# Patient Record
Sex: Female | Born: 1945 | Race: White | Hispanic: No | Smoking: Never smoker
Health system: Southern US, Community
[De-identification: ages and names within clinical notes are randomized; demographics above are authoritative.]

## PROBLEM LIST (undated history)

## (undated) DIAGNOSIS — R569 Unspecified convulsions: Secondary | ICD-10-CM

## (undated) DIAGNOSIS — I639 Cerebral infarction, unspecified: Secondary | ICD-10-CM

## (undated) DIAGNOSIS — F0280 Dementia in other diseases classified elsewhere without behavioral disturbance: Secondary | ICD-10-CM

## (undated) DIAGNOSIS — C801 Malignant (primary) neoplasm, unspecified: Secondary | ICD-10-CM

---

## 2004-02-17 HISTORY — PX: BRAIN SURGERY: SHX531

## 2020-07-26 ENCOUNTER — Other Ambulatory Visit: Payer: Self-pay

## 2020-07-26 ENCOUNTER — Emergency Department (HOSPITAL_COMMUNITY): Payer: Medicare PPO

## 2020-07-26 ENCOUNTER — Emergency Department (HOSPITAL_COMMUNITY)
Admission: EM | Admit: 2020-07-26 | Discharge: 2020-07-26 | Disposition: A | Discharge status: Home / Self Care | Payer: Medicare PPO | Attending: Emergency Medicine | Admitting: Emergency Medicine

## 2020-07-26 ENCOUNTER — Emergency Department (HOSPITAL_BASED_OUTPATIENT_CLINIC_OR_DEPARTMENT_OTHER)
Admission: EM | Admit: 2020-07-26 | Discharge: 2020-07-26 | Disposition: A | Discharge status: Home / Self Care | Payer: Medicare PPO | Source: Home / Self Care | Attending: Emergency Medicine | Admitting: Emergency Medicine

## 2020-07-26 ENCOUNTER — Encounter (HOSPITAL_BASED_OUTPATIENT_CLINIC_OR_DEPARTMENT_OTHER): Payer: Self-pay | Admitting: Emergency Medicine

## 2020-07-26 ENCOUNTER — Encounter (HOSPITAL_COMMUNITY): Payer: Self-pay | Admitting: Emergency Medicine

## 2020-07-26 DIAGNOSIS — Z859 Personal history of malignant neoplasm, unspecified: Secondary | ICD-10-CM | POA: Insufficient documentation

## 2020-07-26 DIAGNOSIS — R079 Chest pain, unspecified: Secondary | ICD-10-CM | POA: Insufficient documentation

## 2020-07-26 HISTORY — DX: Malignant (primary) neoplasm, unspecified: C80.1

## 2020-07-26 LAB — BASIC METABOLIC PANEL
Anion gap: 7 (ref 5–15)
BUN: 10 mg/dL (ref 8–23)
CO2: 28 mmol/L (ref 22–32)
Calcium: 8.8 mg/dL — ABNORMAL LOW (ref 8.9–10.3)
Chloride: 103 mmol/L (ref 98–111)
Creatinine, Ser: 0.69 mg/dL (ref 0.44–1.00)
GFR, Estimated: >60 mL/min (ref >60)
Glucose, Bld: 86 mg/dL (ref 70–99)
Potassium: 3.9 mmol/L (ref 3.5–5.1)
Sodium: 138 mmol/L (ref 135–145)

## 2020-07-26 LAB — CBC WITH DIFFERENTIAL/PLATELET
Abs Immature Granulocytes: 0.02 10*3/uL (ref 0.00–0.07)
Basophils Absolute: 0 10*3/uL (ref 0.0–0.1)
Basophils Relative: 1 %
Eosinophils Absolute: 0.4 10*3/uL (ref 0.0–0.5)
Eosinophils Relative: 5 %
HCT: 42 % (ref 36.0–46.0)
Hemoglobin: 13.6 g/dL (ref 12.0–15.0)
Immature Granulocytes: 0 %
Lymphocytes Relative: 32 %
Lymphs Abs: 2.2 10*3/uL (ref 0.7–4.0)
MCH: 31.6 pg (ref 26.0–34.0)
MCHC: 32.4 g/dL (ref 30.0–36.0)
MCV: 97.4 fL (ref 80.0–100.0)
Monocytes Absolute: 0.5 10*3/uL (ref 0.1–1.0)
Monocytes Relative: 7 %
Neutro Abs: 3.8 10*3/uL (ref 1.7–7.7)
Neutrophils Relative %: 55 %
Platelets: 218 10*3/uL (ref 150–400)
RBC: 4.31 MIL/uL (ref 3.87–5.11)
RDW: 13.6 % (ref 11.5–15.5)
WBC: 6.9 10*3/uL (ref 4.0–10.5)
nRBC: 0 % (ref 0.0–0.2)

## 2020-07-26 LAB — BRAIN NATRIURETIC PEPTIDE: B Natriuretic Peptide: 25.9 pg/mL (ref 0.0–100.0)

## 2020-07-26 LAB — TROPONIN I (HIGH SENSITIVITY)
Troponin I (High Sensitivity): 2 ng/L (ref <18)
Troponin I (High Sensitivity): 3 ng/L (ref <18)

## 2020-07-26 NOTE — ED Provider Notes (Addendum)
Emergency Medicine Provider Triage Evaluation Note  Regina Woods , a 75 y.o. female  was evaluated in triage.  Pt complains of intermittent chest pain x2 weeks. She has had some intermittent shortness off breath as well.  Review of Systems  Positive: Chest pain, sob Negative: Fever  Physical Exam  There were no vitals taken for this visit. Gen:   Awake, no distress   Resp:  Normal effort  MSK:   Moves extremities without difficulty  Other:  Lungs ctab, heart with rrr  Medical Decision Making  Medically screening exam initiated at 3:55 PM.  Appropriate orders placed.  Ellis Koffler was informed that the remainder of the evaluation will be completed by another provider, this initial triage assessment does not replace that evaluation, and the importance of remaining in the ED until their evaluation is complete.     Rodney Booze, PA-C 07/26/20 1556    Breck Hollinger S, PA-C 07/26/20 1557    Noemi Chapel, MD 07/29/20 1459

## 2020-07-26 NOTE — ED Provider Notes (Signed)
[facility] Emergency Department Provider Note MRN:  [id number]  Arrival date & time: 07/26/20     Chief Complaint   Chest Pain   History of Present Illness   Regina Woods is a 75 y.o. year-old female with a history of multiple myeloma presenting to the ED with chief complaint of chest pain.  Intermittent chest pain for the past 2 weeks.  Decided to take the time to pay attention to it today.  Describes it as an arrow type pain, sharp, beneath the left breast.  Cannot think of any exacerbating or alleviating factors.  Denies any dizziness or diaphoresis, no nausea or vomiting, no shortness of breath.  No recent leg pain or swelling.  No cough, no fever.  No abdominal pain, no other complaints.  Symptoms are not currently present.  Left [facility] emergency department to seek care here due to the extended wait times.  Review of Systems  A complete 10 system review of systems was obtained and all systems are negative except as noted in the HPI and PMH.   Patient's Health History    Past Medical History:  Diagnosis Date   Cancer [facility])     History reviewed. No pertinent surgical history.  History reviewed. No pertinent family history.  Social History   Socioeconomic History   Marital status: Unknown    Spouse name: Not on file   Number of children: Not on file   Years of education: Not on file   Highest education level: Not on file  Occupational History   Not on file  Tobacco Use   Smoking status: Not on file   Smokeless tobacco: Not on file  Substance and Sexual Activity   Alcohol use: Not on file   Drug use: Not on file   Sexual activity: Not on file  Other Topics Concern   Not on file  Social History Narrative   Not on file   Social Determinants of Health   Financial Resource Strain: Not on file  Food Insecurity: Not on file  Transportation Needs: Not on file  Physical Activity: Not on file  Stress: Not on file  Social Connections:  Not on file  Intimate Partner Violence: Not on file     Physical Exam   Vitals:   07/26/20 2306  BP: (!) 154/94  Pulse: 74  Resp: 20  Temp: 98.4 F (36.9 C)  SpO2: 100%    CONSTITUTIONAL: Well-appearing, NAD NEURO:  Alert and oriented x 3, no focal deficits EYES:  eyes equal and reactive ENT/NECK:  no LAD, no JVD CARDIO: Regular rate, well-perfused, normal S1 and S2 PULM:  CTAB no wheezing or rhonchi GI/GU:  normal bowel sounds, non-distended, non-tender MSK/SPINE:  No gross deformities, no edema SKIN:  no rash, atraumatic PSYCH:  Appropriate speech and behavior  *Additional and/or pertinent findings included in MDM below  Diagnostic and Interventional Summary    EKG Interpretation  Date/Time:  Friday July 26 2020 23:09:55 EDT Ventricular Rate:  70 PR Interval:  185 QRS Duration: 121 QT Interval:  456 QTC Calculation: 493 R Axis:   -33 Text Interpretation: Sinus rhythm Nonspecific intraventricular conduction delay Confirmed by Gerlene Fee [phone]) on 07/26/2020 11:14:31 PM        Labs Reviewed - No data to display  No orders to display    Medications - No data to display   Procedures  /  Critical Care Procedures  ED Course and Medical Decision Making  I have reviewed the  triage vital signs, the nursing notes, and pertinent available records from the EMR.  Listed above are laboratory and imaging tests that I personally ordered, reviewed, and interpreted and then considered in my medical decision making (see below for details).  Pleasant 75 year old female with no significant cardiovascular risk factors here with an atypical sharp chest pain, favoring MSK versus GERD.  She has no shortness of breath, no tachycardia, no hypoxia, no evidence of DVT.  She does have this cancer history but highly doubt DVT/PE at this time.  Work-up done at [facility] is very reassuring with troponin negative x2, normal chest x-ray.  EKG here is without ischemic findings.  Patient  is appropriate for discharge with PCP follow-up.       Barth Kirks. Sedonia Small, [place] [email or web address]  Final Clinical Impressions(s) / ED Diagnoses     ICD-10-CM   1. Chest pain, unspecified type  R07.9       ED Discharge Orders     None        Discharge Instructions Discussed with and Provided to Patient:    Discharge Instructions      You were evaluated in the Emergency Department and after careful evaluation, we did not find any emergent condition requiring admission or further testing in the hospital.  Your exam/testing today was overall reassuring.  Recommend follow-up with your regular doctors and informing them of your emergency department visit.  Please return to the Emergency Department if you experience any worsening of your condition.  Thank you for allowing [place] to be a part of your care.        Maudie Flakes, MD 07/26/20 [phone]

## 2020-07-26 NOTE — Discharge Instructions (Addendum)
You were evaluated in the Emergency Department and after careful evaluation, we did not find any emergent condition requiring admission or further testing in the hospital.  Your exam/testing today was overall reassuring.  Recommend follow-up with your regular doctors and informing them of your emergency department visit.  Please return to the Emergency Department if you experience any worsening of your condition.  Thank you for allowing [place] to be a part of your care.

## 2020-07-26 NOTE — ED Triage Notes (Signed)
Pt via EMS from [facility] for two weeks of L sided chest pain. Pt has hx of multiple myeloma, receiving monthly infusions.

## 2020-07-26 NOTE — ED Notes (Signed)
Pt called for reassessment x3 with no response 

## 2020-07-26 NOTE — ED Triage Notes (Signed)
  Patient comes in with chest pain that started earlier around 1400.  Patient is a resident at [facility] and was seen earlier at [facility] but left due to wait time.  Patient is pain free at the moment but was never told lab results and wanted to make sure everything was ok before going back home.  No nausea, vomiting, syncope, radiating pain, or SOB.

## 2022-03-14 ENCOUNTER — Other Ambulatory Visit: Payer: Self-pay

## 2022-03-14 ENCOUNTER — Encounter (HOSPITAL_BASED_OUTPATIENT_CLINIC_OR_DEPARTMENT_OTHER): Payer: Self-pay | Admitting: Emergency Medicine

## 2022-03-14 ENCOUNTER — Emergency Department (HOSPITAL_BASED_OUTPATIENT_CLINIC_OR_DEPARTMENT_OTHER): Payer: Medicare PPO | Admitting: Radiology

## 2022-03-14 ENCOUNTER — Emergency Department (HOSPITAL_BASED_OUTPATIENT_CLINIC_OR_DEPARTMENT_OTHER): Payer: Medicare PPO

## 2022-03-14 DIAGNOSIS — R799 Abnormal finding of blood chemistry, unspecified: Secondary | ICD-10-CM | POA: Diagnosis not present

## 2022-03-14 DIAGNOSIS — S8002XA Contusion of left knee, initial encounter: Secondary | ICD-10-CM | POA: Diagnosis not present

## 2022-03-14 DIAGNOSIS — S8991XA Unspecified injury of right lower leg, initial encounter: Secondary | ICD-10-CM | POA: Diagnosis present

## 2022-03-14 DIAGNOSIS — M542 Cervicalgia: Secondary | ICD-10-CM | POA: Diagnosis not present

## 2022-03-14 DIAGNOSIS — W19XXXA Unspecified fall, initial encounter: Secondary | ICD-10-CM | POA: Diagnosis not present

## 2022-03-14 DIAGNOSIS — Y92129 Unspecified place in nursing home as the place of occurrence of the external cause: Secondary | ICD-10-CM | POA: Diagnosis not present

## 2022-03-14 DIAGNOSIS — F0390 Unspecified dementia without behavioral disturbance: Secondary | ICD-10-CM | POA: Insufficient documentation

## 2022-03-14 DIAGNOSIS — S8011XA Contusion of right lower leg, initial encounter: Secondary | ICD-10-CM | POA: Insufficient documentation

## 2022-03-14 DIAGNOSIS — R519 Headache, unspecified: Secondary | ICD-10-CM | POA: Insufficient documentation

## 2022-03-14 LAB — CBC
HCT: 35.7 % — ABNORMAL LOW (ref 36.0–46.0)
Hemoglobin: 11.9 g/dL — ABNORMAL LOW (ref 12.0–15.0)
MCH: 30.1 pg (ref 26.0–34.0)
MCHC: 33.3 g/dL (ref 30.0–36.0)
MCV: 90.4 fL (ref 80.0–100.0)
Platelets: 258 10*3/uL (ref 150–400)
RBC: 3.95 MIL/uL (ref 3.87–5.11)
RDW: 13.4 % (ref 11.5–15.5)
WBC: 10.9 10*3/uL — ABNORMAL HIGH (ref 4.0–10.5)
nRBC: 0 % (ref 0.0–0.2)

## 2022-03-14 LAB — URINALYSIS, ROUTINE W REFLEX MICROSCOPIC
Bilirubin Urine: NEGATIVE
Glucose, UA: NEGATIVE mg/dL
Ketones, ur: NEGATIVE mg/dL
Nitrite: NEGATIVE
Protein, ur: NEGATIVE mg/dL
Specific Gravity, Urine: 1.009 (ref 1.005–1.030)
pH: 5.5 (ref 5.0–8.0)

## 2022-03-14 LAB — BASIC METABOLIC PANEL
Anion gap: 11 (ref 5–15)
BUN: 11 mg/dL (ref 8–23)
CO2: 24 mmol/L (ref 22–32)
Calcium: 9.1 mg/dL (ref 8.9–10.3)
Chloride: 100 mmol/L (ref 98–111)
Creatinine, Ser: 1.03 mg/dL — ABNORMAL HIGH (ref 0.44–1.00)
GFR, Estimated: 56 mL/min — ABNORMAL LOW (ref >60)
Glucose, Bld: 130 mg/dL — ABNORMAL HIGH (ref 70–99)
Potassium: 3.2 mmol/L — ABNORMAL LOW (ref 3.5–5.1)
Sodium: 135 mmol/L (ref 135–145)

## 2022-03-14 LAB — CBG MONITORING, ED: Glucose-Capillary: 137 mg/dL — ABNORMAL HIGH (ref 70–99)

## 2022-03-14 NOTE — ED Triage Notes (Signed)
Unwitnessed Fall, lost balance Lives at [facility] Dementia/confusion at baseline C/o headache, bruising/ wound on right calf, bruising to left knee. Denies other pain ambulatory to triage with walker

## 2022-03-14 NOTE — ED Provider Triage Note (Signed)
Emergency Medicine Provider Triage Evaluation Note  Aleane Wesenberg , a 77 y.o. female  was evaluated in triage.  Pt complains of fall. Hx of dementia.  Fell at the nursing facility last night.  Bruising to L knee and R tibfib. Poor historian. Not on anticoagulant  Review of Systems  Positive: As above Negative: As above  Physical Exam  BP 119/71 (BP Location: Right Arm)   Pulse 89   Temp 98.4 F (36.9 C) (Oral)   Resp 20   SpO2 98%  Gen:   Awake, no distress   Resp:  Normal effort  MSK:   Moves extremities without difficulty  Other:    Medical Decision Making  Medically screening exam initiated at 9:26 PM.  Appropriate orders placed.  Lina Hitch was informed that the remainder of the evaluation will be completed by another provider, this initial triage assessment does not replace that evaluation, and the importance of remaining in the ED until their evaluation is complete.     Domenic Moras, PA-C 03/14/22 2127

## 2022-03-15 ENCOUNTER — Emergency Department (HOSPITAL_BASED_OUTPATIENT_CLINIC_OR_DEPARTMENT_OTHER)
Admission: EM | Admit: 2022-03-15 | Discharge: 2022-03-15 | Disposition: A | Discharge status: Skilled Nursing Facility | Payer: Medicare PPO | Attending: Emergency Medicine | Admitting: Emergency Medicine

## 2022-03-15 DIAGNOSIS — S0990XA Unspecified injury of head, initial encounter: Secondary | ICD-10-CM

## 2022-03-15 DIAGNOSIS — W19XXXA Unspecified fall, initial encounter: Secondary | ICD-10-CM

## 2022-03-15 DIAGNOSIS — S161XXA Strain of muscle, fascia and tendon at neck level, initial encounter: Secondary | ICD-10-CM

## 2022-03-15 DIAGNOSIS — S8011XA Contusion of right lower leg, initial encounter: Secondary | ICD-10-CM

## 2022-03-15 DIAGNOSIS — S8002XA Contusion of left knee, initial encounter: Secondary | ICD-10-CM

## 2022-03-15 DIAGNOSIS — N3000 Acute cystitis without hematuria: Secondary | ICD-10-CM

## 2022-03-15 HISTORY — DX: Unspecified convulsions: R56.9

## 2022-03-15 HISTORY — DX: Cerebral infarction, unspecified: I63.9

## 2022-03-15 HISTORY — DX: Dementia in other diseases classified elsewhere, unspecified severity, without behavioral disturbance, psychotic disturbance, mood disturbance, and anxiety: F02.80

## 2022-03-15 MED ORDER — CIPROFLOXACIN HCL 500 MG PO TABS: 500.0000 mg | ORAL_TABLET | Freq: Two times a day (BID) | ORAL | 0 refills | Status: AC

## 2022-03-15 MED ORDER — CIPROFLOXACIN HCL 500 MG PO TABS
500.0000 mg | ORAL_TABLET | Freq: Once | ORAL | Status: AC
  Administered 2022-03-15: 500 mg via ORAL
  Filled 2022-03-15: qty 1

## 2022-03-15 NOTE — ED Provider Notes (Signed)
[facility] Provider Note   CSN: [id number] Arrival date & time: 03/14/22  2107     History  Chief Complaint  Patient presents with   Lytle Michaels    Tanajah Boulter is a 77 y.o. female.  Patient is a 77 year old female with past medical history of dementia, prior surgery for brain tumor.  Patient presenting today for evaluation of fall.  The details of the injury are somewhat unclear due to the patient's history of dementia, but I am told that she fell attempting to walk.  She apparently hit her head.  She was complaining of pain in her head, neck, left knee, and right lower leg.  There is bruising to the right lower leg with an overlying blister.  Additional history given from the son present at bedside.  The history is provided by the patient.  Fall This is a new problem.       Home Medications Prior to Admission medications   Not on File      Allergies    Penicillins    Review of Systems   Review of Systems  Unable to perform ROS: Dementia    Physical Exam Updated Vital Signs BP (!) 115/58   Pulse 78   Temp 98.4 F (36.9 C) (Oral)   Resp 20   SpO2 97%  Physical Exam Vitals and nursing note reviewed.  Constitutional:      General: She is not in acute distress.    Appearance: She is well-developed. She is not diaphoretic.  HENT:     Head: Normocephalic and atraumatic.  Eyes:     Extraocular Movements: Extraocular movements intact.     Pupils: Pupils are equal, round, and reactive to light.  Cardiovascular:     Rate and Rhythm: Normal rate and regular rhythm.     Heart sounds: No murmur heard.    No friction rub. No gallop.  Pulmonary:     Effort: Pulmonary effort is normal. No respiratory distress.     Breath sounds: Normal breath sounds. No wheezing.  Abdominal:     General: Bowel sounds are normal. There is no distension.     Palpations: Abdomen is soft.     Tenderness: There is no abdominal tenderness.   Musculoskeletal:        General: Normal range of motion.     Cervical back: Normal range of motion and neck supple.     Comments: To the right lower extremity, there is a contusion noted with bruising and a central area of a blister.  There is some tension of the leg in this area, but DP pulses are easily palpable and motor and sensation are intact throughout the entire foot.  She has no pain with range of motion of the ankle or toes.  Skin:    General: Skin is warm and dry.  Neurological:     General: No focal deficit present.     Mental Status: She is alert.     Cranial Nerves: No cranial nerve deficit.     ED Results / Procedures / Treatments   Labs (all labs ordered are listed, but only abnormal results are displayed) Labs Reviewed  BASIC METABOLIC PANEL - Abnormal; Notable for the following components:      Result Value   Potassium 3.2 (*)    Glucose, Bld 130 (*)    Creatinine, Ser 1.03 (*)    GFR, Estimated 56 (*)    All other components within normal limits  CBC - Abnormal; Notable for the following components:   WBC 10.9 (*)    Hemoglobin 11.9 (*)    HCT 35.7 (*)    All other components within normal limits  URINALYSIS, ROUTINE W REFLEX MICROSCOPIC - Abnormal; Notable for the following components:   APPearance HAZY (*)    Hgb urine dipstick SMALL (*)    Leukocytes,Ua LARGE (*)    Bacteria, UA RARE (*)    Non Squamous Epithelial 0-5 (*)    All other components within normal limits  CBG MONITORING, ED - Abnormal; Notable for the following components:   Glucose-Capillary 137 (*)    All other components within normal limits    EKG None  Radiology CT Head Wo Contrast  Result Date: 03/14/2022 CLINICAL DATA:  Recent fall with confusion, initial encounter EXAM: CT HEAD WITHOUT CONTRAST TECHNIQUE: Contiguous axial images were obtained from the base of the skull through the vertex without intravenous contrast. RADIATION DOSE REDUCTION: This exam was performed according  to the departmental dose-optimization program which includes automated exposure control, adjustment of the mA and/or kV according to patient size and/or use of iterative reconstruction technique. COMPARISON:  None Available. 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 Similar changes are noted in the right frontal lobe also consistent with prior infarct. No acute hemorrhage, acute infarction or space-occupying mass lesion is seen. Vascular: No hyperdense vessel or unexpected calcification. Skull: Postsurgical changes are noted in the right middle cranial fossa extending superiorly into the right frontal lobe. Sinuses/Orbits: No acute finding. Other: None. 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 No acute abnormality noted. Electronically Signed   By: Inez Catalina M.D.   On: 03/14/2022 22:03   DG Knee Complete 4 Views Left  Result Date: 03/14/2022 CLINICAL DATA:  Recent fall with left knee pain, initial encounter EXAM: LEFT KNEE - COMPLETE 4+ VIEW COMPARISON:  None Available. FINDINGS: Tricompartmental degenerative changes are noted. No acute fracture or dislocation is seen. No soft tissue abnormality is noted. IMPRESSION: Degenerative change without acute abnormality. Electronically Signed   By: Inez Catalina M.D.   On: 03/14/2022 22:01   DG Tibia/Fibula Right  Result Date: 03/14/2022 CLINICAL DATA:  Recent fall with right leg pain, initial encounter EXAM: RIGHT TIBIA AND FIBULA - 2 VIEW COMPARISON:  None Available. FINDINGS: Soft tissue swelling is noted consistent with the recent injury particularly in the mid portion of the calf medially. No acute fracture or dislocation is noted. No other focal abnormality is noted. IMPRESSION: Soft tissue swelling consistent with the given clinical history. No definitive fracture is seen. Electronically Signed   By: Inez Catalina M.D.    On: 03/14/2022 22:01    Procedures Procedures    Medications Ordered in ED Medications - No data to display  ED Course/ Medical Decision Making/ A&P  Patient brought by her son for evaluation of a fall.  She was brought from her memory care facility with unclear injury mechanism.  Patient's complaints are pain in the head and neck along with bruising to the right lower leg and left knee.  Patient arrives here with stable vital signs.  She is confused at baseline, but otherwise is at her level of mental status.  Workup initiated including CBC and basic metabolic panel, both of which were unremarkable.  She does have evidence for UTI on her urinalysis and this will be treated with Keflex.  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  CT scan of the head and cervical spine both negative for acute traumatic injury.  At this point, I feels the patient can safely be discharged.  The contusion to the leg while growing in size, requires no intervention.  I suspect she either contused the small artery or varicose vein when she fell.  She is to follow-up as needed.  Final Clinical Impression(s) / ED Diagnoses Final diagnoses:  None    Rx / DC Orders ED Discharge Orders     None         Veryl Speak, MD 03/15/22 [phone]

## 2022-03-15 NOTE — Discharge Instructions (Signed)
Begin taking Cipro as prescribed.  Return to the emergency department if you develop any new and/or concerning symptoms.

## 2022-05-22 IMAGING — DX DG CHEST 2V
2 series · 2 of 2 positions shown · non-contrast
Comparison: None.

CLINICAL DATA: Shortness of breath

EXAM:
CHEST - 2 VIEW

[chest pa]
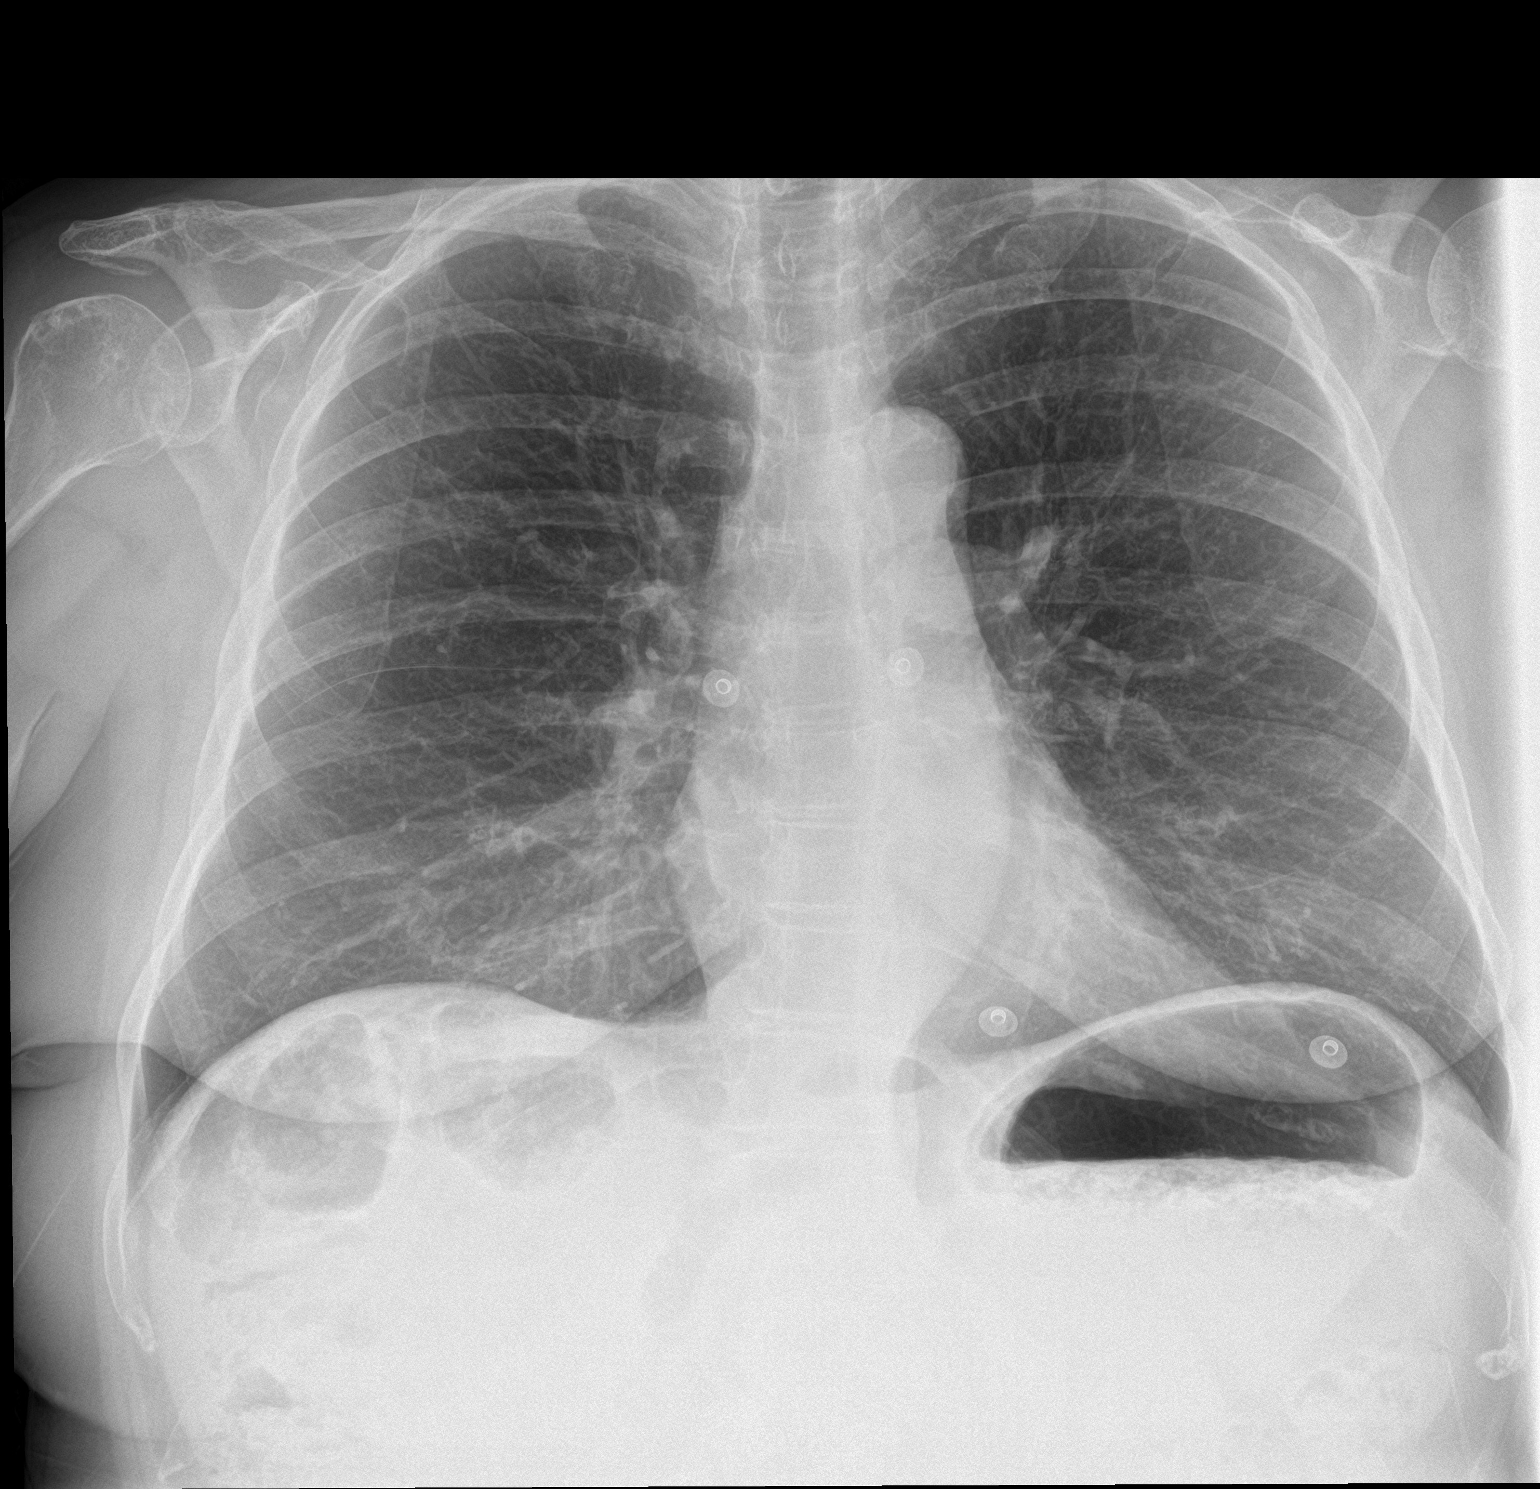

[chest lat]
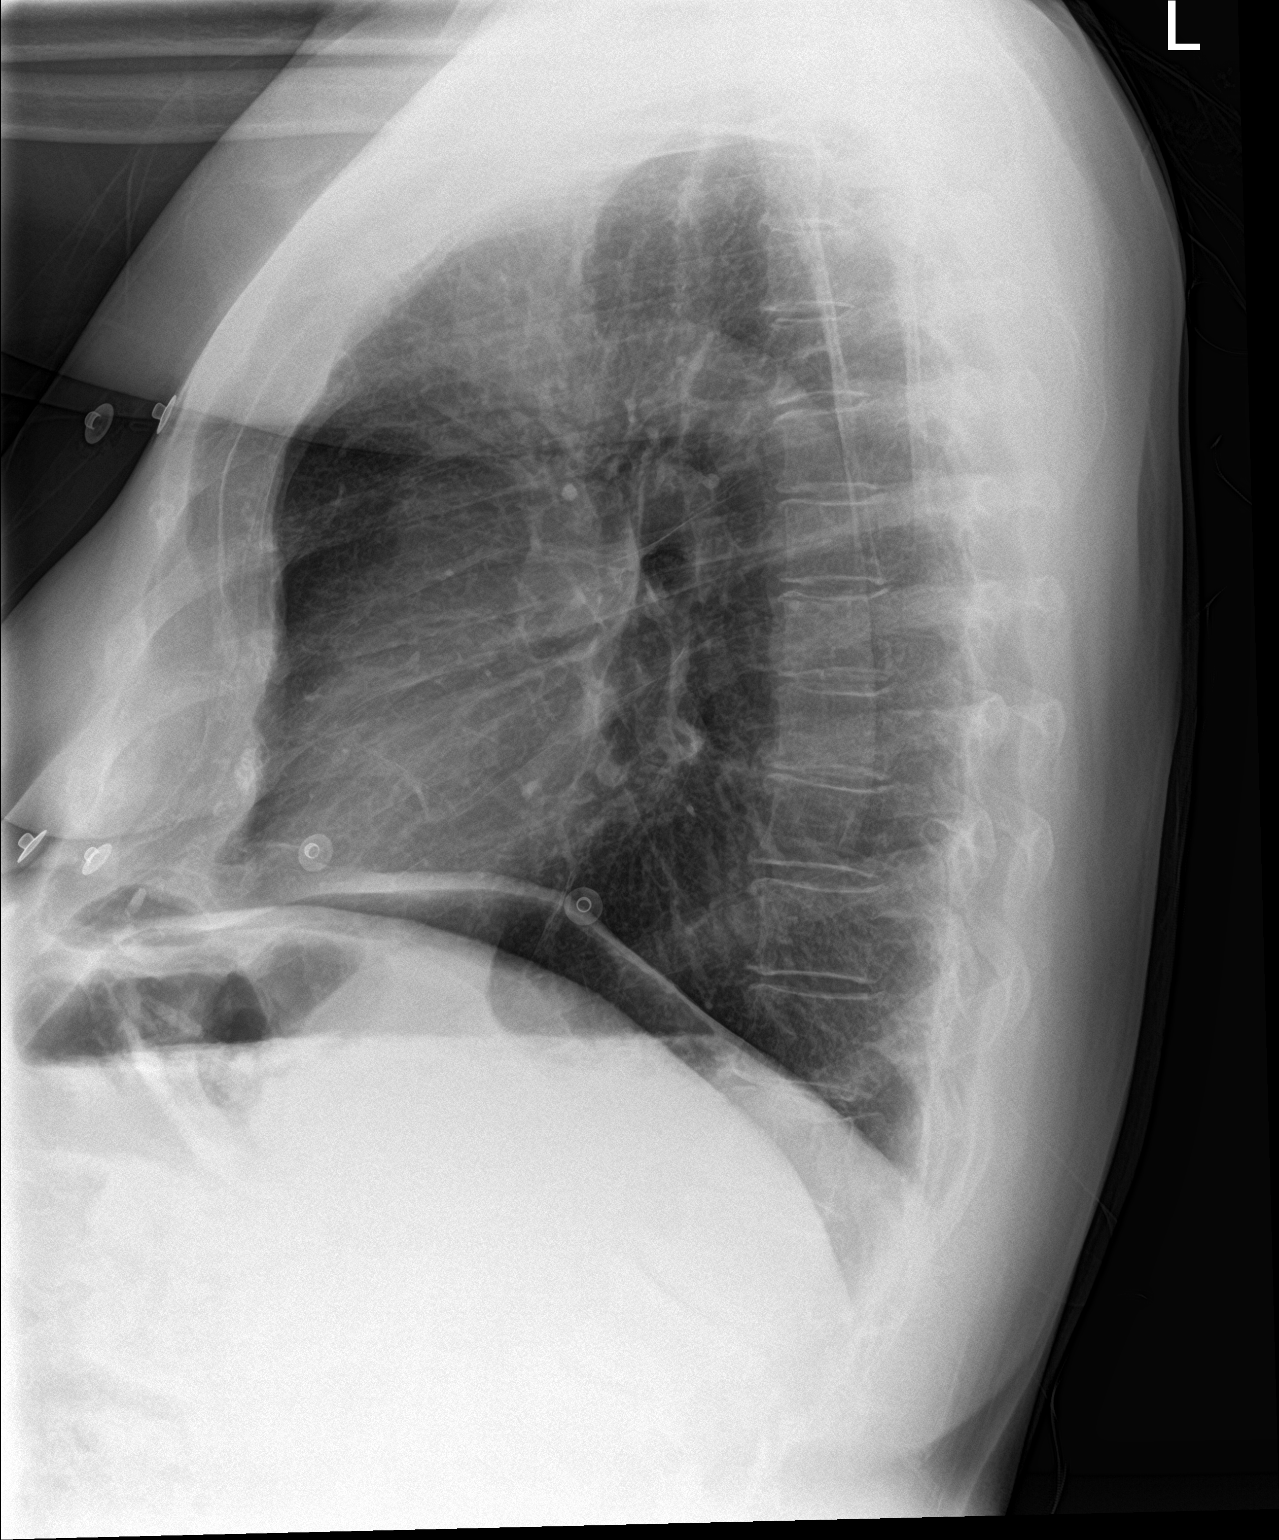

[2 of 2 positions shown; findings below may reference images not displayed]

FINDINGS: The heart size and mediastinal contours are within normal limits.
Both lungs are clear. No pleural effusion or pneumothorax. No acute
osseous abnormality.
IMPRESSION: No active cardiopulmonary disease.
